# Patient Record
Sex: Male | Born: 1995 | Race: White | Hispanic: No | Marital: Married | Smoking: Current every day smoker
Health system: Southern US, Community
[De-identification: ages and names within clinical notes are randomized; demographics above are authoritative.]

---
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
## 2018-09-01 ENCOUNTER — Emergency Department (INDEPENDENT_AMBULATORY_CARE_PROVIDER_SITE_OTHER): Payer: Self-pay

## 2018-09-01 ENCOUNTER — Other Ambulatory Visit: Payer: Self-pay

## 2018-09-01 ENCOUNTER — Emergency Department
Admission: EM | Admit: 2018-09-01 | Discharge: 2018-09-01 | Disposition: A | Discharge status: Home / Self Care | Payer: Self-pay | Source: Home / Self Care | Attending: Family Medicine | Admitting: Family Medicine

## 2018-09-01 DIAGNOSIS — M79672 Pain in left foot: Secondary | ICD-10-CM

## 2018-09-01 DIAGNOSIS — S9032XA Contusion of left foot, initial encounter: Secondary | ICD-10-CM

## 2018-09-01 NOTE — Discharge Instructions (Addendum)
Apply ice pack for 20 minutes, 2 to 3 times daily  Continue until pain and swelling decrease.  Elevate foot as much as possible.  May take Ibuprofen 200mg , 4 tabs every 8 hours with food.  Wear ace wrap until swelling resolves.

## 2018-09-01 NOTE — ED Triage Notes (Signed)
Pt c/o LT foot pain since 330 when he dropped a glass TV stand on his foot when moving furniture. Pain 4/10

## 2018-09-01 NOTE — ED Provider Notes (Signed)
[facility]    CSN: [id number] Arrival date & time: 09/01/18  1758     History   Chief Complaint Chief Complaint  Patient presents with  . Foot Pain    LT    HPI Hunter GoltzJoseph W Rogers is a 23 y.o. male.   Patient dropped a heavy glass TV stand on the dorsum of his left foot three hours ago, resulting in immediate pain/swelling.  The history is provided by the patient.  Foot Injury  Location:  Foot Time since incident:  3 hours Injury: yes   Mechanism of injury comment:  Falling object Foot location:  Dorsum of L foot Chronicity:  New Dislocation: no   Foreign body present:  No foreign bodies Prior injury to area:  No Relieved by:  None tried Worsened by:  Activity and bearing weight Ineffective treatments:  Ice Associated symptoms: decreased ROM, stiffness and swelling   Associated symptoms: no muscle weakness, no numbness and no tingling     History reviewed. No pertinent past medical history.  There are no active problems to display for this patient.   History reviewed. No pertinent surgical history.     Home Medications    Prior to Admission medications   Not on File    Family History History reviewed. No pertinent family history.  Social History Social History   Tobacco Use  . Smoking status: Current Every Day Smoker  Substance Use Topics  . Alcohol use: Never    Frequency: Never  . Drug use: Never     Allergies   Vantin [cefpodoxime]   Review of Systems Review of Systems  Musculoskeletal: Positive for stiffness.  All other systems reviewed and are negative.    Physical Exam Triage Vital Signs ED Triage Vitals [09/01/18 1817]  Enc Vitals Group     BP 128/84     Pulse Rate 86     Resp      Temp 98.1 F (36.7 C)     Temp Source Oral     SpO2 97 %     Weight (!) 340 lb (154.2 kg)     Height 6' (1.829 m)     Head Circumference      Peak Flow      Pain Score 4     Pain Loc      Pain Edu?      Excl. in GC?    No  data found.  Updated Vital Signs BP 128/84 (BP Location: Right Arm)   Pulse 86   Temp 98.1 F (36.7 C) (Oral)   Ht 6' (1.829 m)   Wt (!) 154.2 kg   SpO2 97%   BMI 46.11 kg/m   Visual Acuity Right Eye Distance:   Left Eye Distance:   Bilateral Distance:    Right Eye Near:   Left Eye Near:    Bilateral Near:     Physical Exam Vitals signs and nursing note reviewed.  Constitutional:      General: He is not in acute distress.    Appearance: He is obese.  HENT:     Head: Atraumatic.     Nose: Nose normal.  Eyes:     Pupils: Pupils are equal, round, and reactive to light.  Neck:     Musculoskeletal: Normal range of motion.  Cardiovascular:     Rate and Rhythm: Normal rate.  Pulmonary:     Effort: Pulmonary effort is normal.  Musculoskeletal:     Left foot: Normal range of  motion and normal capillary refill. Tenderness, bony tenderness and swelling present. No deformity or laceration.       Feet:     Comments: Dorsum of left foot has swelling, tenderness, and mild ecchymosis as noted on diagram.  Distal neurovascular function is intact.   Skin:    General: Skin is warm and dry.  Neurological:     Mental Status: He is alert and oriented to person, place, and time.      UC Treatments / Results  Labs (all labs ordered are listed, but only abnormal results are displayed) Labs Reviewed - No data to display  EKG None  Radiology Dg Foot Complete Left  Result Date: 09/01/2018 CLINICAL DATA:  Dropped coffee table LEFT foot 3 hours ago. EXAM: LEFT FOOT - COMPLETE 3+ VIEW COMPARISON:  None. FINDINGS: No acute fracture deformity or dislocation. Type 2 navicular bone. No destructive bony lesions. Soft tissue planes are not suspicious. IMPRESSION: No acute osseous process. Electronically Signed   By: Awilda Metro M.D.   On: 09/01/2018 18:36    Procedures Procedures (including critical care time)  Medications Ordered in UC Medications - No data to display  Initial  Impression / Assessment and Plan / UC Course  I have reviewed the triage vital signs and the nursing notes.  Pertinent labs & imaging results that were available during my care of the patient were reviewed by me and considered in my medical decision making (see chart for details).    Ace wrap applied. Followup with Dr. Rodney Langton or Dr. Clementeen [facility] ([facility]) if not improving about two weeks.    Final Clinical Impressions(s) / UC Diagnoses   Final diagnoses:  Contusion of left foot, initial encounter     Discharge Instructions     Apply ice pack for 20 minutes, 2 to 3 times daily  Continue until pain and swelling decrease.  Elevate foot as much as possible.  May take Ibuprofen 200mg , 4 tabs every 8 hours with food.  Wear ace wrap until swelling resolves.    ED Prescriptions    None        Lattie Haw, MD 09/02/18 1322

## 2019-11-27 IMAGING — DX DG FOOT COMPLETE 3+V*L*
3 series · 3 of 3 positions shown · non-contrast
Comparison: None.

CLINICAL DATA: Dropped coffee table LEFT foot 3 hours ago.

EXAM:
LEFT FOOT - COMPLETE 3+ VIEW

[foot ap]
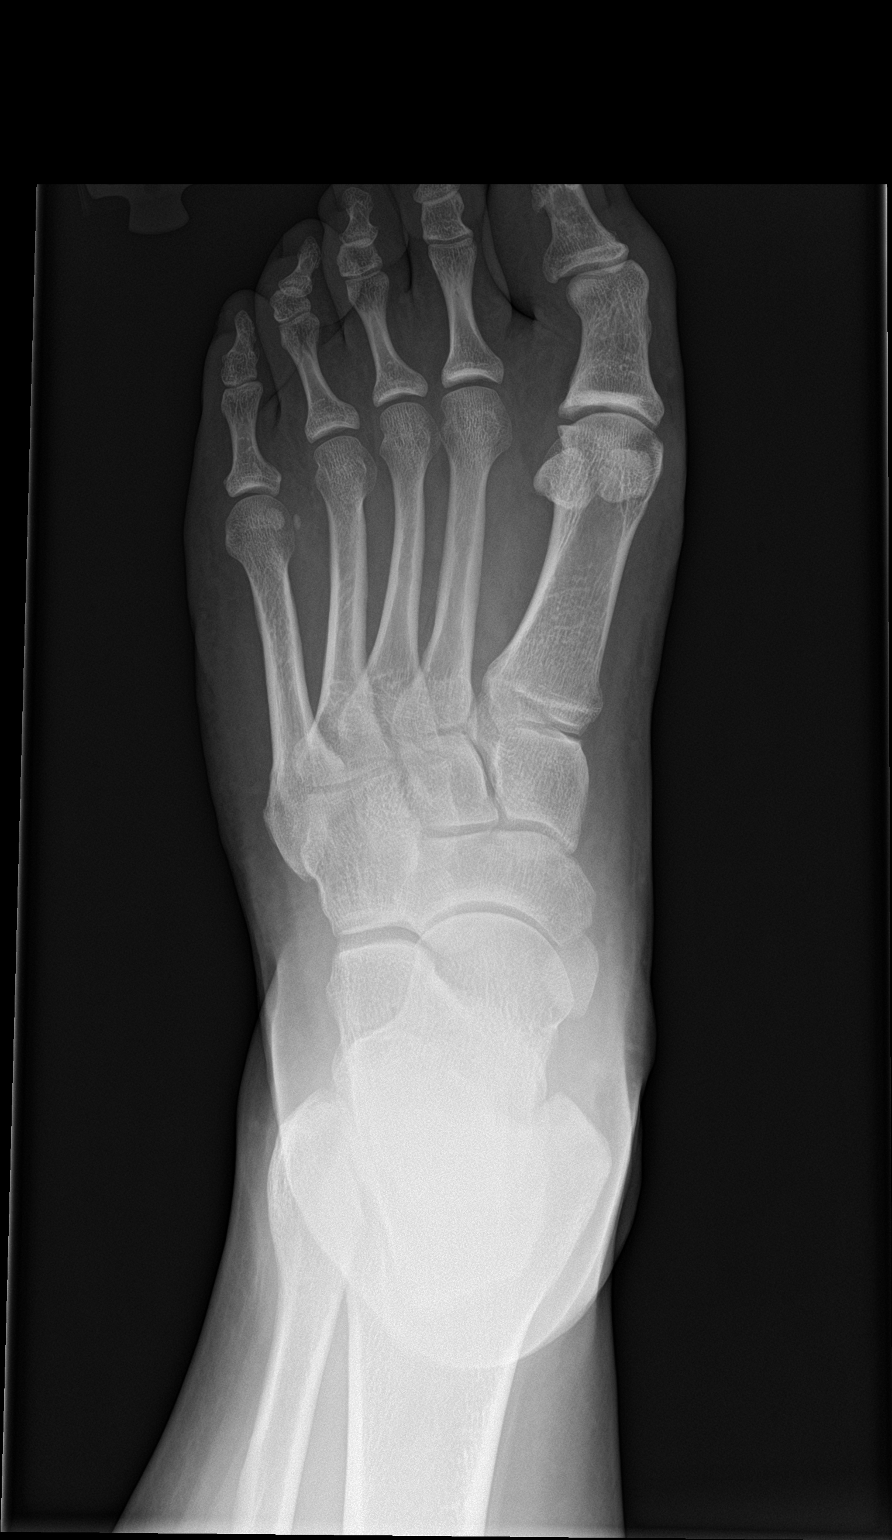

[foot obl]
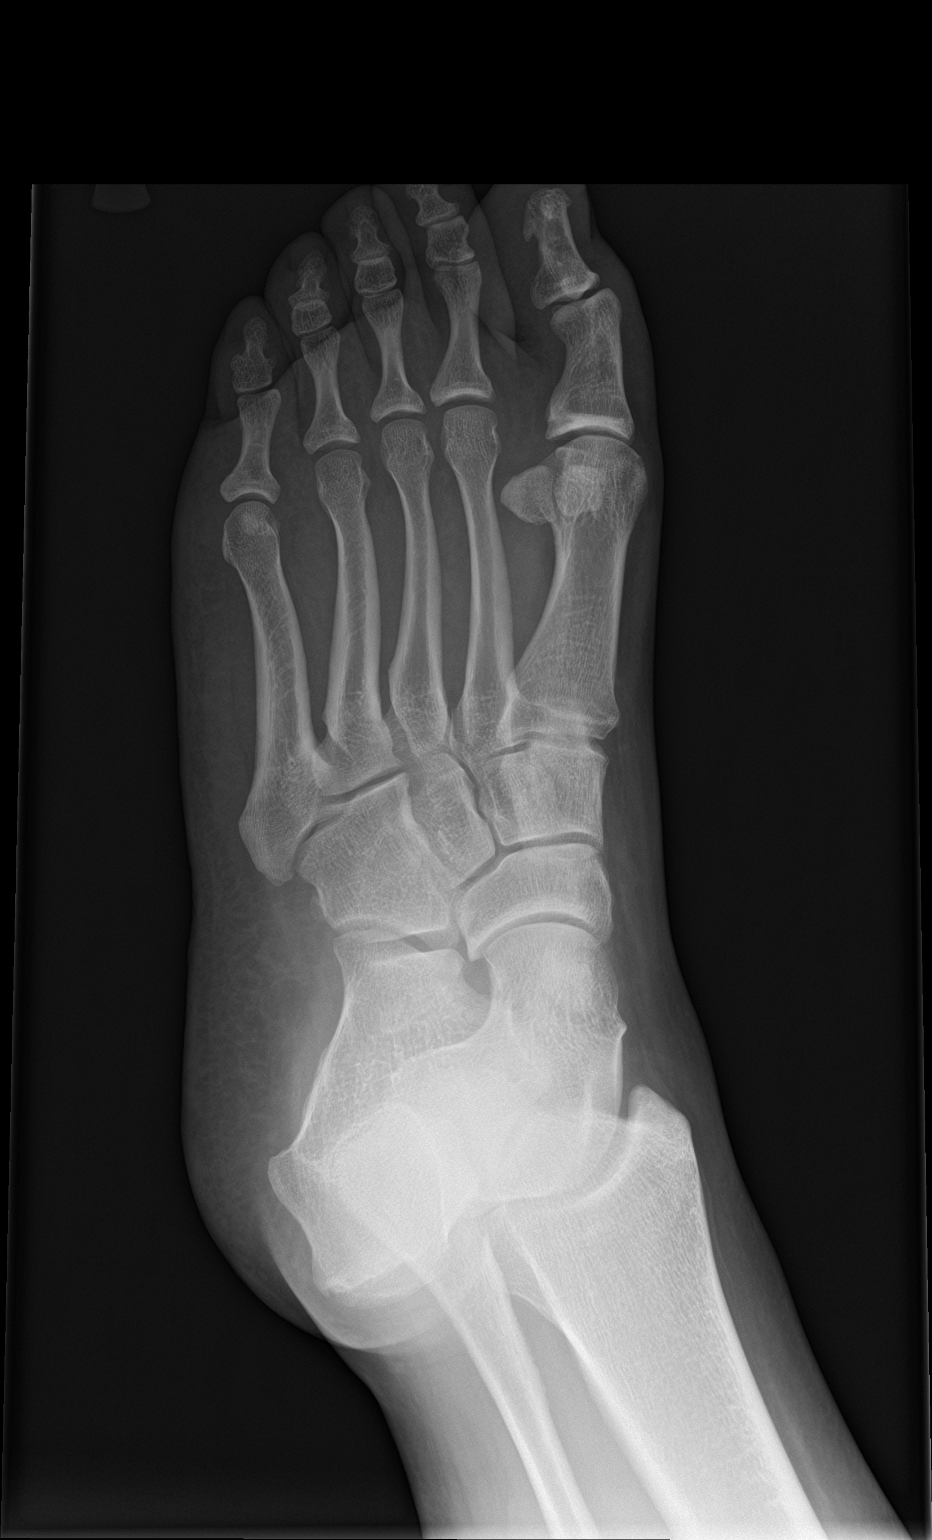

[foot lat]
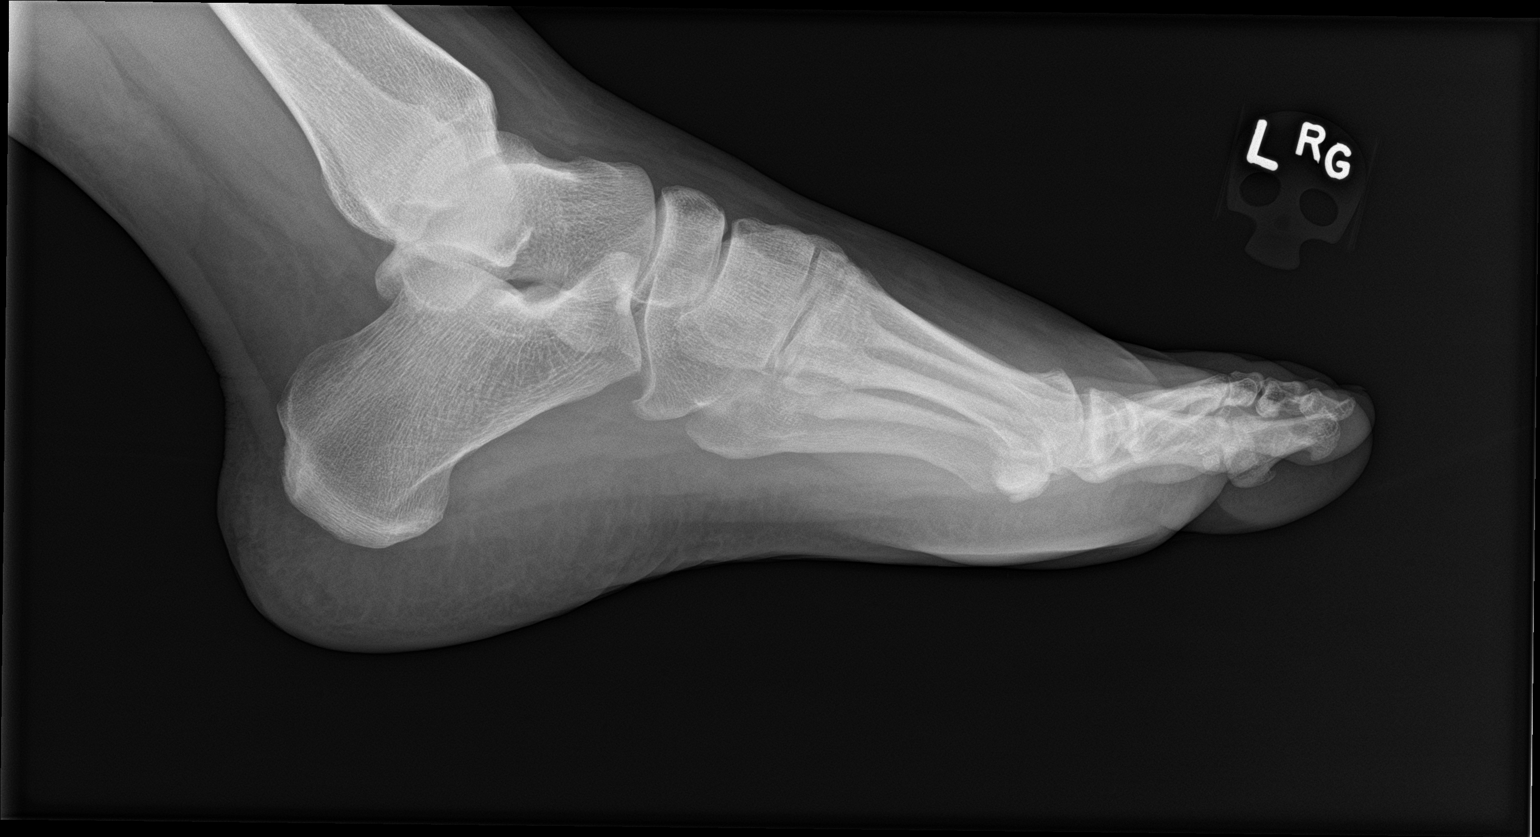

[3 of 3 positions shown; findings below may reference images not displayed]

FINDINGS: No acute fracture deformity or dislocation. Type 2 navicular bone.
No destructive bony lesions. Soft tissue planes are not suspicious.
IMPRESSION: No acute osseous process.
